# Patient Record
Sex: Female | Born: 1975 | Race: White | Hispanic: No | Marital: Married | Smoking: Never smoker
Health system: Southern US, Community
[De-identification: ages and names within clinical notes are randomized; demographics above are authoritative.]

## PROBLEM LIST (undated history)

## (undated) DIAGNOSIS — R002 Palpitations: Secondary | ICD-10-CM

## (undated) HISTORY — PX: OTHER SURGICAL HISTORY: SHX169

## (undated) HISTORY — PX: SEPTOPLASTY: SHX2393

## (undated) HISTORY — DX: Palpitations: R00.2

---

## 2004-01-29 ENCOUNTER — Inpatient Hospital Stay (HOSPITAL_COMMUNITY): Admission: AD | Admit: 2004-01-29 | Discharge: 2004-01-30 | Payer: Self-pay | Admitting: Gynecology

## 2004-11-26 ENCOUNTER — Ambulatory Visit (HOSPITAL_COMMUNITY): Admission: RE | Admit: 2004-11-26 | Discharge: 2004-11-26 | Payer: Self-pay | Admitting: Gynecology

## 2005-04-14 ENCOUNTER — Inpatient Hospital Stay (HOSPITAL_COMMUNITY): Admission: AD | Admit: 2005-04-14 | Discharge: 2005-04-16 | Payer: Self-pay | Admitting: Gynecology

## 2006-01-05 ENCOUNTER — Ambulatory Visit: Payer: Self-pay | Admitting: Gynecology

## 2006-09-20 ENCOUNTER — Ambulatory Visit: Payer: Self-pay | Admitting: Gynecology

## 2006-09-20 ENCOUNTER — Encounter (INDEPENDENT_AMBULATORY_CARE_PROVIDER_SITE_OTHER): Payer: Self-pay | Admitting: Gynecology

## 2007-07-14 ENCOUNTER — Ambulatory Visit: Payer: Self-pay | Admitting: Gynecology

## 2007-07-14 ENCOUNTER — Encounter (INDEPENDENT_AMBULATORY_CARE_PROVIDER_SITE_OTHER): Payer: Self-pay | Admitting: Gynecology

## 2008-11-08 ENCOUNTER — Ambulatory Visit: Payer: Self-pay | Admitting: Unknown Physician Specialty

## 2009-10-30 ENCOUNTER — Encounter: Payer: Self-pay | Admitting: Cardiology

## 2010-02-10 ENCOUNTER — Ambulatory Visit: Payer: Self-pay | Admitting: Cardiology

## 2010-02-10 DIAGNOSIS — R0789 Other chest pain: Secondary | ICD-10-CM | POA: Insufficient documentation

## 2010-02-10 DIAGNOSIS — R002 Palpitations: Secondary | ICD-10-CM | POA: Insufficient documentation

## 2010-02-10 DIAGNOSIS — E7800 Pure hypercholesterolemia, unspecified: Secondary | ICD-10-CM | POA: Insufficient documentation

## 2010-02-11 ENCOUNTER — Telehealth (INDEPENDENT_AMBULATORY_CARE_PROVIDER_SITE_OTHER): Payer: Self-pay | Admitting: *Deleted

## 2010-02-18 ENCOUNTER — Encounter: Payer: Self-pay | Admitting: Cardiology

## 2010-02-19 ENCOUNTER — Ambulatory Visit (HOSPITAL_COMMUNITY): Admission: RE | Admit: 2010-02-19 | Discharge: 2010-02-19 | Payer: Self-pay | Admitting: Cardiology

## 2010-02-19 ENCOUNTER — Ambulatory Visit: Payer: Self-pay

## 2010-02-19 ENCOUNTER — Ambulatory Visit: Payer: Self-pay | Admitting: Cardiology

## 2010-02-19 ENCOUNTER — Ambulatory Visit: Payer: Self-pay | Admitting: Cardiovascular Disease

## 2010-02-19 ENCOUNTER — Encounter: Payer: Self-pay | Admitting: Cardiology

## 2010-02-28 ENCOUNTER — Encounter: Payer: Self-pay | Admitting: Cardiology

## 2010-03-11 ENCOUNTER — Telehealth: Payer: Self-pay | Admitting: Cardiology

## 2010-04-22 ENCOUNTER — Encounter: Payer: Self-pay | Admitting: Cardiology

## 2010-04-23 ENCOUNTER — Ambulatory Visit: Payer: Self-pay | Admitting: Orthopedic Surgery

## 2010-04-25 LAB — PATHOLOGY REPORT

## 2010-06-18 NOTE — Progress Notes (Signed)
Summary: 24 hr Holter Monitor  Phone Note Outgoing Call Call back at [phone]   Call placed by: Stanton Kidney, EMT-P,  February 11, 2010 8:38 AM Call placed to: Patient Action Taken: Phone Call Completed Summary of Call: 24 holter monitor schedule 02/19/10 after stress echo appt.

## 2010-06-18 NOTE — Progress Notes (Signed)
Summary: LABS  Phone Note Call from Patient Call back at Home Phone [phone]   Caller: SELF Call For: [facility] Summary of Call: PT IS CALLING ABOUT BLOODWORK THAT WAS DONE AT [facility] 3 WEEKS AGO-IT DOES NOT LOOK LIKE THEY SENT IT TO [place] Initial call taken by: Harlon Flor,  March 11, 2010 11:12 AM  Follow-up for Phone Call        pt aware of labs Follow-up by: Benedict Needy, RN,  March 12, 2010 4:48 PM

## 2010-06-18 NOTE — Letter (Signed)
Summary: Medical Record Release  Medical Record Release   Imported By: Harlon Flor 04/24/2010 14:17:57  _____________________________________________________________________  External Attachment:    Type:   Image     Comment:   External Document

## 2010-06-18 NOTE — Assessment & Plan Note (Signed)
Summary: np6/family hx of early mi   Visit Type:  Initial Consult Referring Provider:  Harold Hedge II, M.D.  CC:  c/o two weeks ago had a spell where she was putting up beach chairs on a shelf and loss feeling in arms and had to drop the chairs.  She states he heart speeds up and then bottoms out. Marland Kitchen  History of Present Illness: 35 yo with very prominent family history of premature coronary disease presents for cardiology evaluation.  Patient had one brother with an MI at 35, another brother with an MI at 19 (who has now had 4 MIs), a sister with an MI at 6, father with PTCA at 65 and CVA at 77, and mother with "enlarged heart" diagnosed at 29.  She, in general, has done well.  No significant medical history.  She has had about 1 year of episodes where she will feel her heart racing.  This happens every few days and lasts for a couple of minutes at a time.  This has worried her.  2 weeks ago, she was lifting some boxes to put on a shelf and developed a pressure in her chest that lasted 5 minutes.  At the same time, she felt very weak and felt like her heart was racing.  No history of syncope.  No history of exertional chest pain/pressure or dyspnea with her moderate level of regular exercise. She does not drink caffeine and does not smoke.   ECG: NSR, normal  Labs (6/11): LDL 76, HDL 52  Preventive Screening-Counseling & Management  Alcohol-Tobacco     Smoking Status: never  Caffeine-Diet-Exercise     Does Patient Exercise: yes  Current Medications (verified): 1)  None  Allergies (verified): 1)  ! Sulfa  Past History:  Family History: Last updated: 02/10/2010 Brother #1: MI at 19 Brother #2: 4 MIs total, first at 48 Sister: MI at 49 Father: PTCA at 82, CVA at 70, multiple TIAs, died at 92 Mother: "enlarged heart" diagnosed at 9, had PCI at 52  Social History: Last updated: 02/10/2010 Married, lives in [place], has 2 children Tobacco Use - No.  Alcohol Use -  no Regular Exercise - yes--3-4 x weekly No caffeine  Past Medical History: 1. migraines 2. palpitations 3. uterine ablation  Past Surgical History: septoplasty gum reconstruciton  Family History: Brother #1: MI at 58 Brother #2: 4 MIs total, first at 17 Sister: MI at 7 Father: PTCA at 94, CVA at 82, multiple TIAs, died at 27 Mother: "enlarged heart" diagnosed at 73, had PCI at 2  Social History: Married, lives in [place], has 2 children Tobacco Use - No.  Alcohol Use - no Regular Exercise - yes--3-4 x weekly No caffeine Smoking Status:  never Does Patient Exercise:  yes  Review of Systems       All systems reviewed and negative except as per HPI.   Vital Signs:  Patient profile:   35 year old female Height:      67 inches Weight:      161 pounds BMI:     25.31 Pulse rate:   69 / minute BP sitting:   104 / 72  (left arm) Cuff size:   regular  Vitals Entered By: Bishop Dublin, CMA (February 10, 2010 2:46 PM)  Physical Exam  General:  Well developed, well nourished, in no acute distress. Head:  normocephalic and atraumatic Nose:  no deformity, discharge, inflammation, or lesions Mouth:  Teeth, gums and palate normal. Oral mucosa normal.  Neck:  Neck supple, no JVD. No masses, thyromegaly or abnormal cervical nodes. Lungs:  Clear bilaterally to auscultation and percussion. Heart:  Non-displaced PMI, chest non-tender; regular rate and rhythm, S1, S2 without murmurs, rubs or gallops. Carotid upstroke normal, no bruit. Pedals normal pulses. No edema, no varicosities. Abdomen:  Bowel sounds positive; abdomen soft and non-tender without masses, organomegaly, or hernias noted. No hepatosplenomegaly. Msk:  Back normal, normal gait. Muscle strength and tone normal. Extremities:  No clubbing or cyanosis. Neurologic:  Alert and oriented x 3. Skin:  Intact without lesions or rashes. Psych:  Normal affect.   Impression & Recommendations:  Problem # 1:  CHEST  DISCOMFORT (ICD-786.59) Episode of chest pressure lasting for about 5 minutes 2 weeks ago while reaching up to a shelf.  No exertional symptoms.  Suspect this is not cardiac, but given patient's very strong family history of premature CAD presenting in the 30s, I think we probably need to investigate this farther.  I will schedule her for a stress echo.    Problem # 2:  PALPITATIONS (ICD-785.1) Patient feels her heart race periodically.  No history of syncope but it is bothersome.  I will set her up for a 48 hour holter monitor.   Problem # 3:  FAMILY HISTORY OF CAD Patient has a very strong family history of premature coronary disease, with all siblings having MIs in their 30s.  She no longer has her period (s/p uterine ablation) and has no history of pathologic bleeding, so I think that she should start ASA 81 mg daily.  Her lipids looked good in 6/11, but given her family history I am going to set her up for a Lipomed profile to assess LDL particle number, etc.  Also given family history, I will check lipoprotein(a) and hs-CRP.  If any of these are abnormal, I will start a statin.   I asked her to take fish oil 2000 mg daily.   Other Orders: Stress Echo (Stress Echo) Holter (Holter) T-NMR, Lipoprofile ([id number])  Patient Instructions: 1)  Your physician has recommended you make the following change in your medication: START fish oil and aspirin 2)  Your physician has requested that you have a stress echocardiogram. For further information please visit [email or web address]  Please follow instruction sheet as given. 3)  Your physician has recommended that you wear a holter monitor.  Holter monitors are medical devices that record the heart's electrical activity. Doctors most often use these monitors to diagnose arrhythmias. Arrhythmias are problems with the speed or rhythm of the heartbeat. The monitor is a small, portable device. You can wear one while you do your normal daily activities. This  is usually used to diagnose what is causing palpitations/syncope (passing out).  Appended Document: Orders Update    Clinical Lists Changes  Orders: Added new Test order of T-Lipoprotein (a) [phone]) - Signed Added new Test order of CRP, high sensitivity-[facility] [phone]) - Signed

## 2010-06-18 NOTE — Procedures (Signed)
Summary: summary report  summary report   Imported By: Mirna Mires 02/28/2010 11:06:14  _____________________________________________________________________  External Attachment:    Type:   Image     Comment:   External Document

## 2010-11-28 ENCOUNTER — Encounter: Payer: Self-pay | Admitting: Cardiology

## 2011-01-07 ENCOUNTER — Ambulatory Visit: Payer: Self-pay | Admitting: Plastic Surgery

## 2012-09-08 ENCOUNTER — Emergency Department: Payer: Self-pay | Admitting: Unknown Physician Specialty

## 2012-09-08 LAB — CBC
HGB: 12.9 g/dL (ref 12.0–16.0)
MCH: 31.5 pg (ref 26.0–34.0)
MCV: 90 fL (ref 80–100)
RBC: 4.11 10*6/uL (ref 3.80–5.20)
RDW: 12.1 % (ref 11.5–14.5)
WBC: 5.2 10*3/uL (ref 3.6–11.0)

## 2012-09-08 LAB — URINALYSIS, COMPLETE
Bilirubin,UR: NEGATIVE
Glucose,UR: NEGATIVE mg/dL (ref 0–75)

## 2012-09-08 LAB — MAGNESIUM: Magnesium: 1.6 mg/dL — ABNORMAL LOW

## 2012-09-08 LAB — COMPREHENSIVE METABOLIC PANEL
Albumin: 3 g/dL — ABNORMAL LOW (ref 3.4–5.0)
Anion Gap: 6 — ABNORMAL LOW (ref 7–16)
Bilirubin,Total: 0.5 mg/dL (ref 0.2–1.0)
Chloride: 107 mmol/L (ref 98–107)
Creatinine: 0.55 mg/dL — ABNORMAL LOW (ref 0.60–1.30)
EGFR (Non-African Amer.): >60
Glucose: 105 mg/dL — ABNORMAL HIGH (ref 65–99)
Potassium: 3 mmol/L — ABNORMAL LOW (ref 3.5–5.1)
SGPT (ALT): 164 U/L — ABNORMAL HIGH (ref 12–78)

## 2012-09-08 LAB — LIPASE, BLOOD: Lipase: 204 U/L (ref 73–393)

## 2012-09-08 LAB — HCG, QUANTITATIVE, PREGNANCY: Beta Hcg, Quant.: <1 m[IU]/mL — ABNORMAL LOW

## 2012-09-19 ENCOUNTER — Ambulatory Visit: Payer: Self-pay | Admitting: Gastroenterology

## 2012-09-19 LAB — HEPATIC FUNCTION PANEL A (ARMC)
Albumin: 3.7 g/dL (ref 3.4–5.0)
Alkaline Phosphatase: 133 U/L (ref 50–136)
Bilirubin, Direct: 0.1 mg/dL (ref 0.00–0.20)
SGOT(AST): 56 U/L — ABNORMAL HIGH (ref 15–37)

## 2012-09-22 ENCOUNTER — Ambulatory Visit: Payer: Self-pay | Admitting: Gastroenterology

## 2012-09-26 ENCOUNTER — Other Ambulatory Visit: Payer: Self-pay | Admitting: Gastroenterology

## 2012-09-26 LAB — HEPATIC FUNCTION PANEL A (ARMC)
Bilirubin, Direct: 0.2 mg/dL (ref 0.00–0.20)
Bilirubin,Total: 0.6 mg/dL (ref 0.2–1.0)
SGOT(AST): 21 U/L (ref 15–37)

## 2012-09-26 LAB — PROTIME-INR
INR: 1.1
Prothrombin Time: 14 secs (ref 11.5–14.7)

## 2012-09-26 LAB — SEDIMENTATION RATE: Erythrocyte Sed Rate: 4 mm/hr (ref 0–20)

## 2012-09-26 LAB — LACTATE DEHYDROGENASE: LDH: 172 U/L (ref 81–246)

## 2012-09-26 LAB — CK: CK, Total: 56 U/L (ref 21–215)

## 2012-09-27 ENCOUNTER — Other Ambulatory Visit: Payer: Self-pay | Admitting: Obstetrics and Gynecology

## 2012-09-27 DIAGNOSIS — N6320 Unspecified lump in the left breast, unspecified quadrant: Secondary | ICD-10-CM

## 2012-10-06 DIAGNOSIS — R197 Diarrhea, unspecified: Secondary | ICD-10-CM | POA: Insufficient documentation

## 2012-10-06 DIAGNOSIS — R748 Abnormal levels of other serum enzymes: Secondary | ICD-10-CM | POA: Insufficient documentation

## 2012-10-06 DIAGNOSIS — R109 Unspecified abdominal pain: Secondary | ICD-10-CM | POA: Insufficient documentation

## 2012-10-08 DIAGNOSIS — R5383 Other fatigue: Secondary | ICD-10-CM | POA: Insufficient documentation

## 2012-10-11 ENCOUNTER — Ambulatory Visit
Admission: RE | Admit: 2012-10-11 | Discharge: 2012-10-11 | Disposition: A | Discharge status: Home / Self Care | Payer: BC Managed Care – PPO | Source: Ambulatory Visit | Attending: Obstetrics and Gynecology | Admitting: Obstetrics and Gynecology

## 2012-10-11 DIAGNOSIS — N6320 Unspecified lump in the left breast, unspecified quadrant: Secondary | ICD-10-CM

## 2014-12-13 IMAGING — CT CT ABD-PELV W/ CM
1 of 2 series · 15 of 32 positions shown, 19 images · IV contrast (isovue)
Comparison: none

REASON FOR EXAM: (1) abd pain; (2) abd pain
COMMENTS:

PROCEDURE:     CT  - CT ABDOMEN / PELVIS  W  - September 08, 2012  [DATE]
RESULT:     Technique: Helical 3 mm sections were obtained from the lung
bases through the superior iliac crest status post intravenous
administration of 85mL Isovue 370.

[Series 2: 3mm soft tissue · axial · 0.73mm/px · z∈[-504,-75]mm · 15 of 157 slices shown, 19 images]
[im 7/157  soft-tissue]
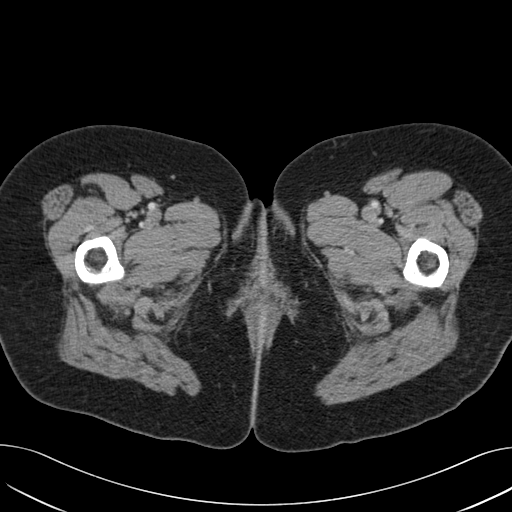
[im 7/157  bone]
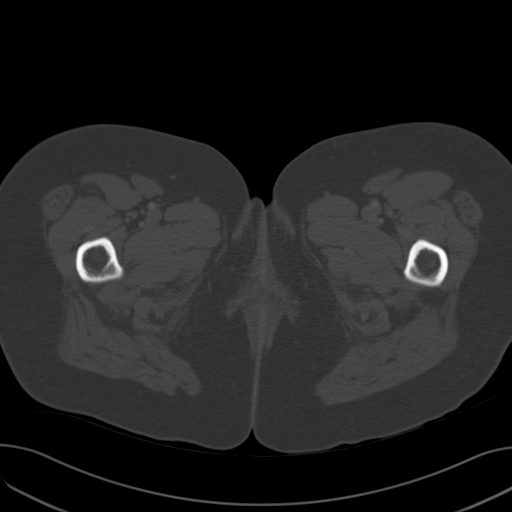
[im 19/157  soft-tissue]
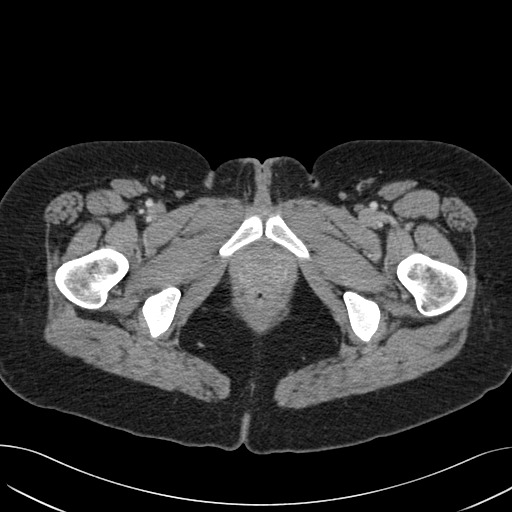
[im 32/157  soft-tissue]
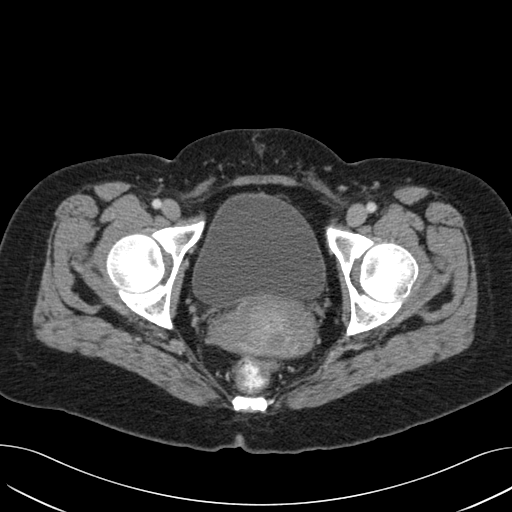
[im 44/157  soft-tissue]
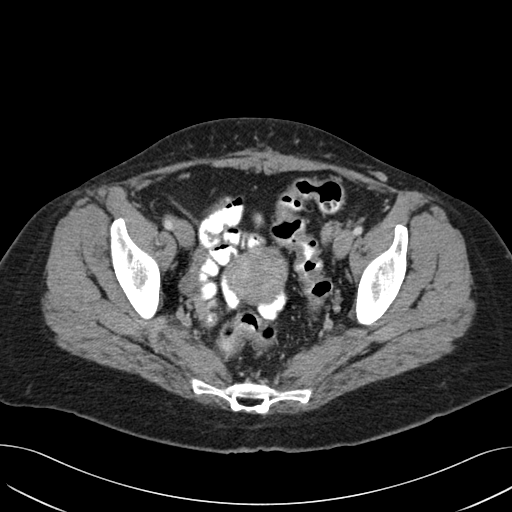
[im 57/157  soft-tissue]
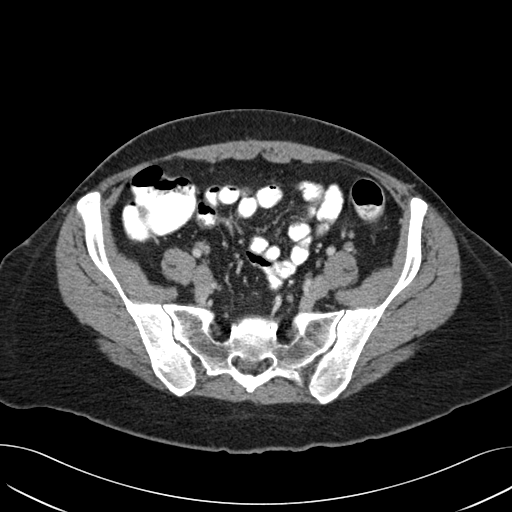
[im 69/157  soft-tissue]
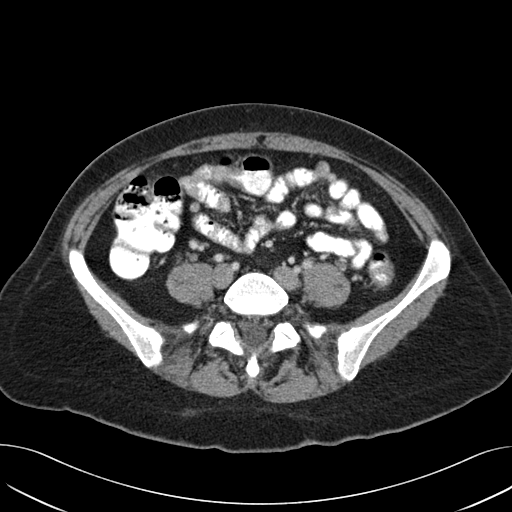
[im 82/157  soft-tissue]
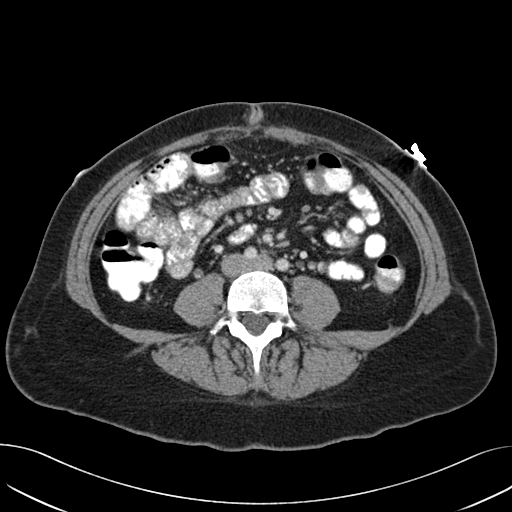
[im 88/157  soft-tissue]
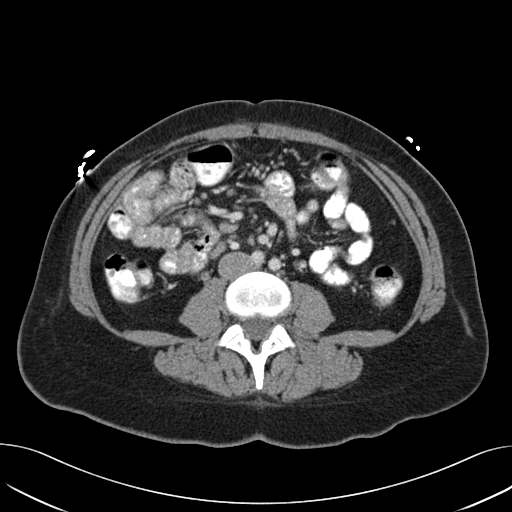
[im 100/157  soft-tissue]
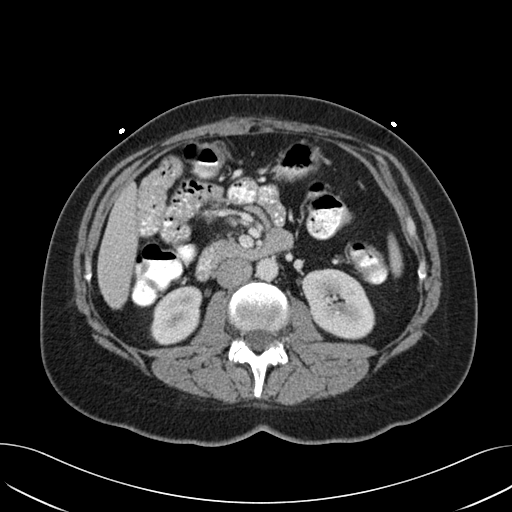
[im 100/157  bone]
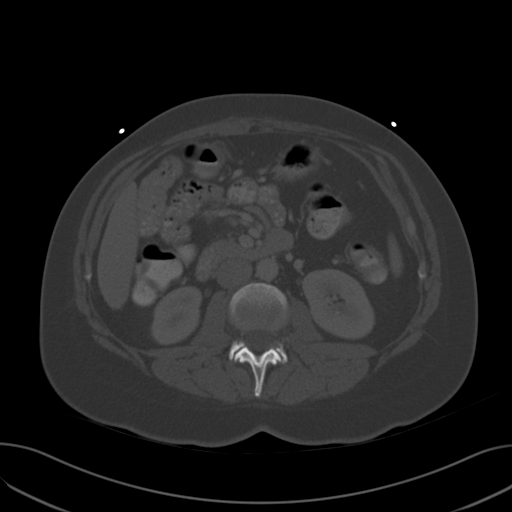
[im 113/157  soft-tissue]
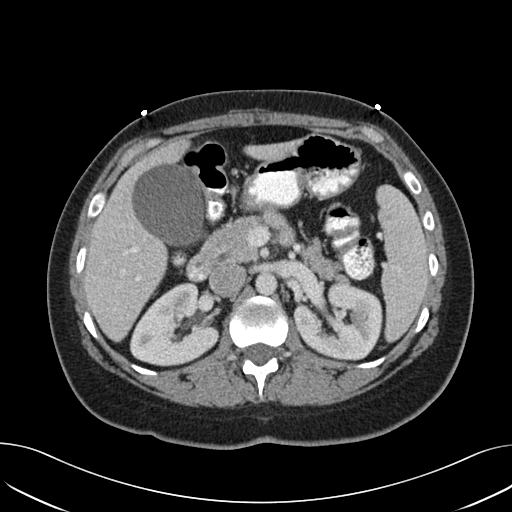
[im 125/157  soft-tissue]
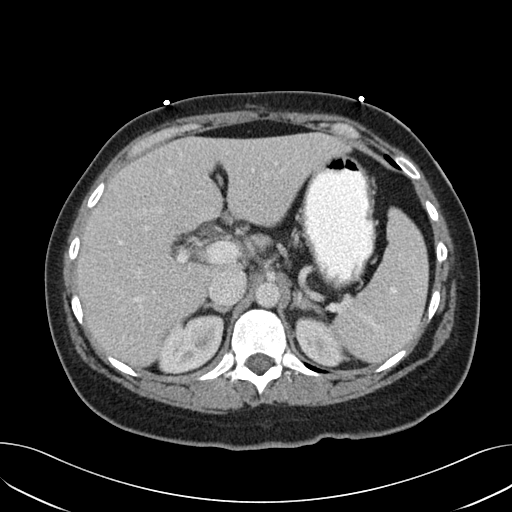
[im 132/157  lung]
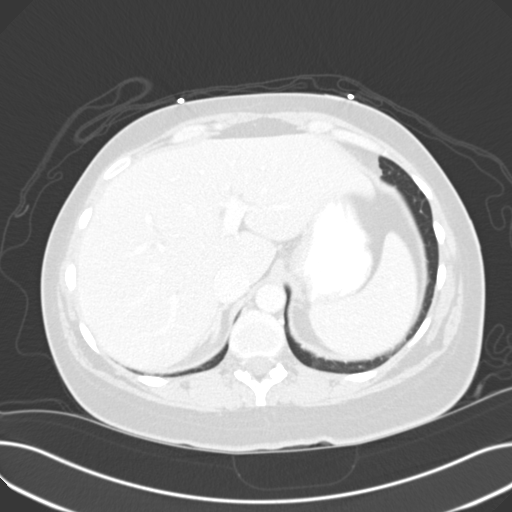
[im 138/157  soft-tissue]
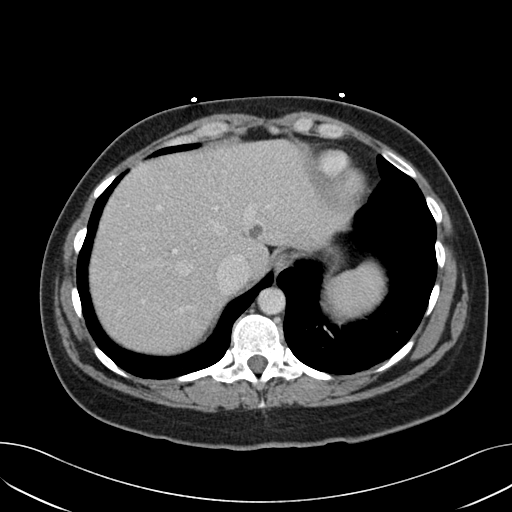
[im 138/157  lung]
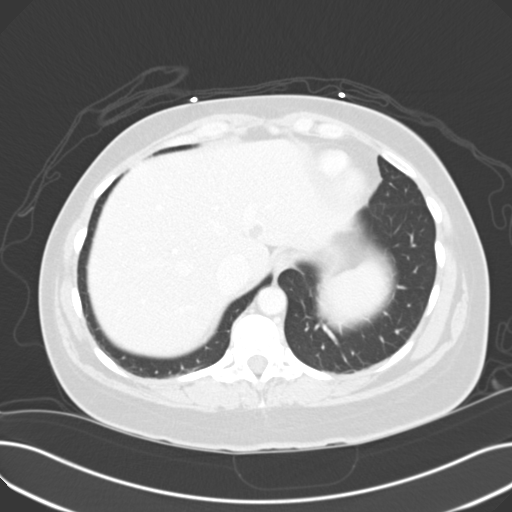
[im 144/157  lung]
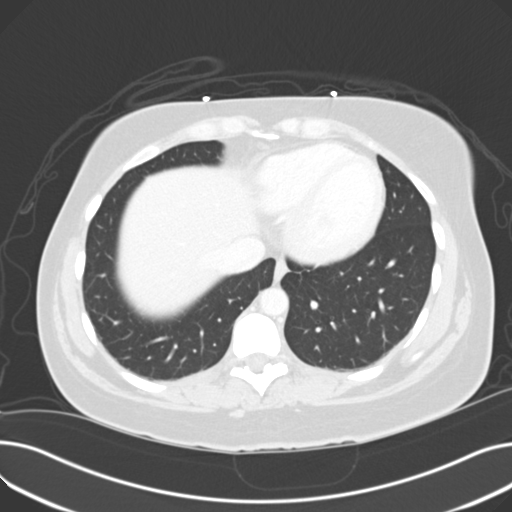
[im 150/157  soft-tissue]
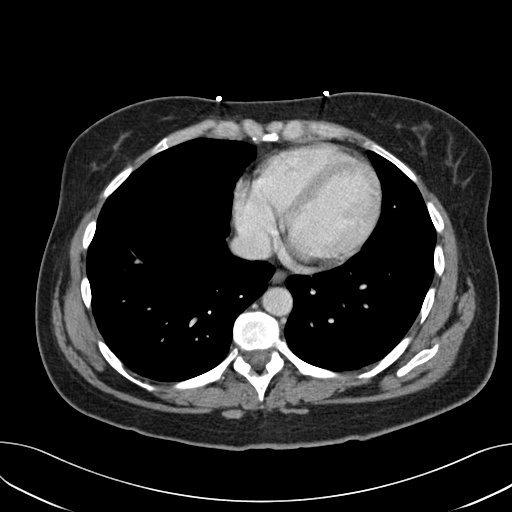
[im 150/157  lung]
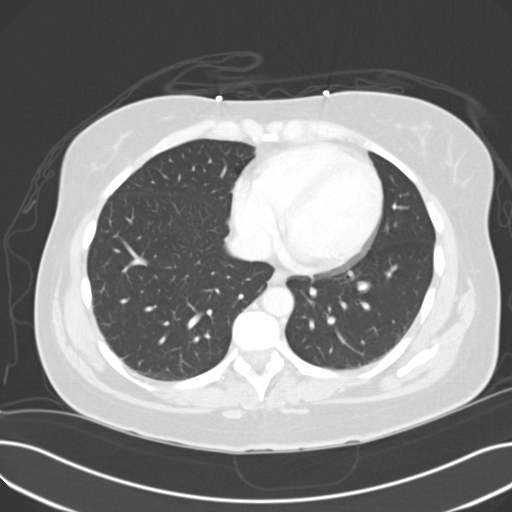

[15 of 32 positions shown; findings below may reference images not displayed]

FINDINGS: The lung bases are unremarkable.

The liver, spleen, adrenals, pancreas, and kidneys are unremarkable.

There is not evidence of bowel obstruction, enteritis, colitis nor
diverticulitis.

There is not evidence of an abdominal aortic aneurysm. The celiac, SMA, IMA,
portal vein, and SMV are opacified.

The gallbladder fossa is unremarkable. The appendix is identified and is
unremarkable.

There is no evidence of abdominal free fluid, loculated fluid collections,
masses nor adenopathy.

Mild to moderately prominent lymph nodes identified within the right lower
quadrant. The largest measures 1.03 cm in short axis on image #87. These may
represent reactive is from prior infection or if clinically appropriate the
sequela of inflammatory disease. There is no evidence of retroperitoneal
adenopathy an etiology such as and heme poetic disorder is much lower
differential consideration though clinical correlation recommended.
IMPRESSION: A one mild to moderately prominent lymph nodes the right
lower quadrant clinical correlation recommended. Different considerations
are reactive lymph nodes from infectious or inflammatory etiologies. Ominous
etiologies again cannot be excluded clinically appropriate.
2. Otherwise No further CT evidence of obstructive or inflammatory
abnormalities.

## 2014-12-13 IMAGING — US US PELV - US TRANSVAGINAL
1 series · 14 of 25 positions shown · non-contrast
Comparison: none

REASON FOR EXAM: acute pelvic pain
COMMENTS:   LMP: N/A

[Series 1: us pelv - us transvaginal · 0.28mm/px · 14 of 62 slices shown]
[im 1/62]
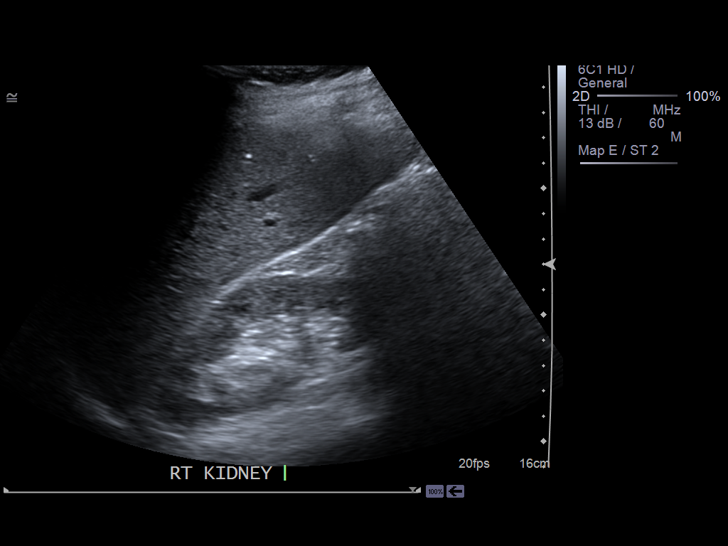
[im 6/62]
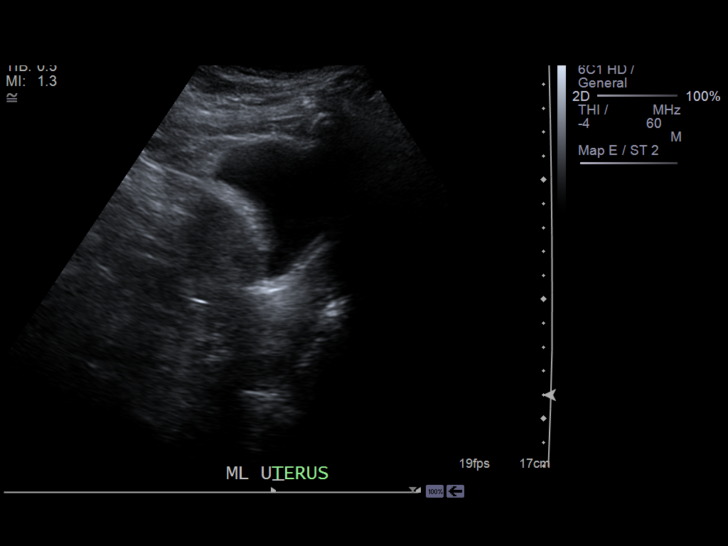
[im 11/62]
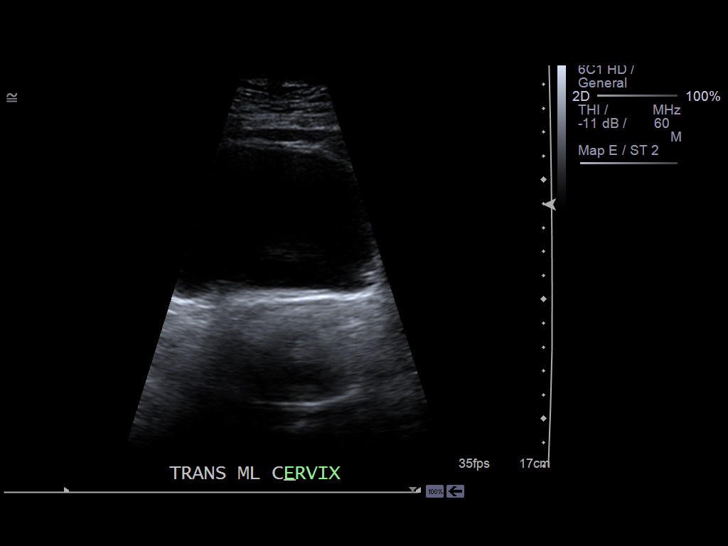
[im 16/62]
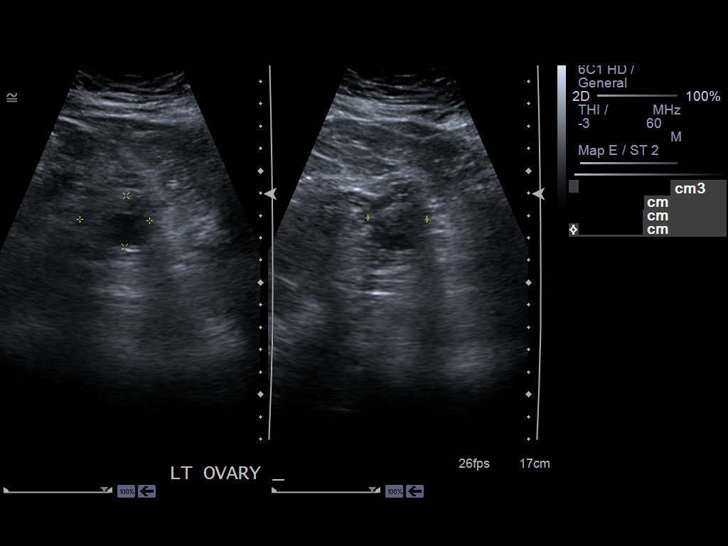
[im 21/62]
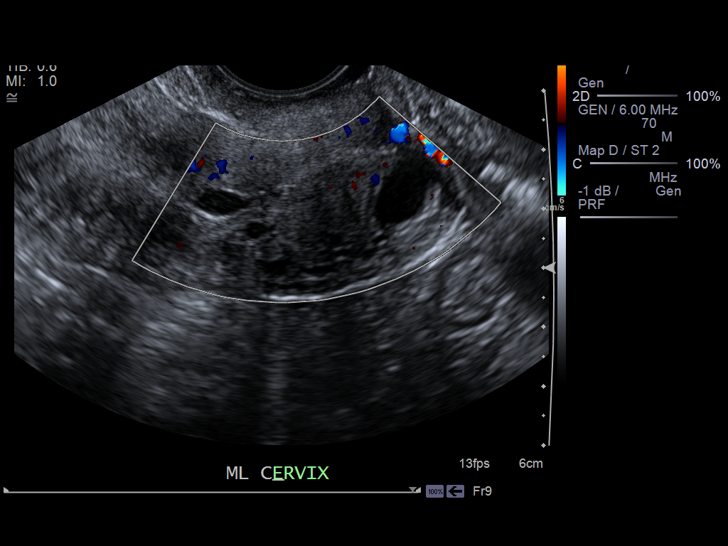
[im 23/62]
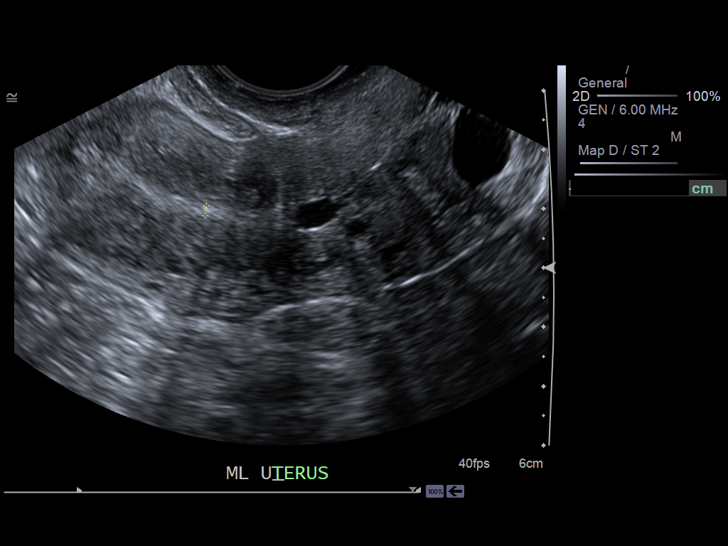
[im 28/62]
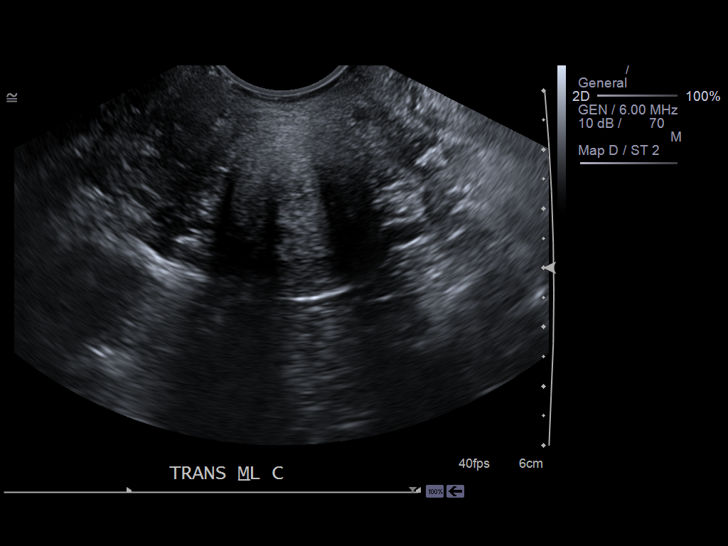
[im 34/62]
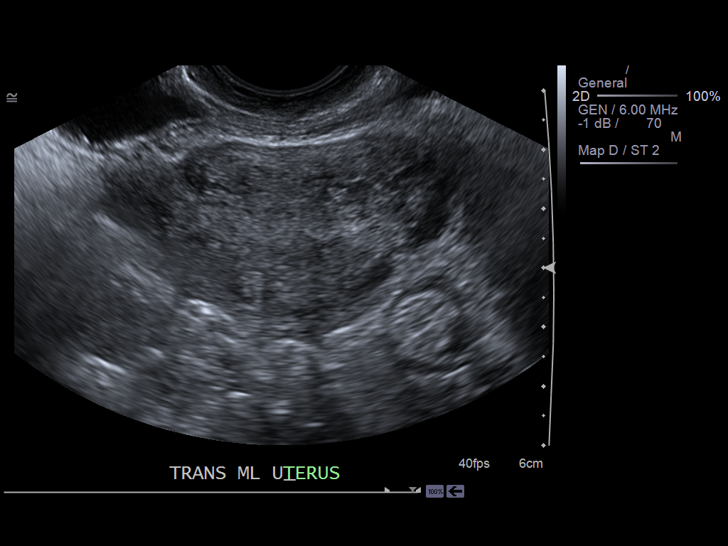
[im 39/62]
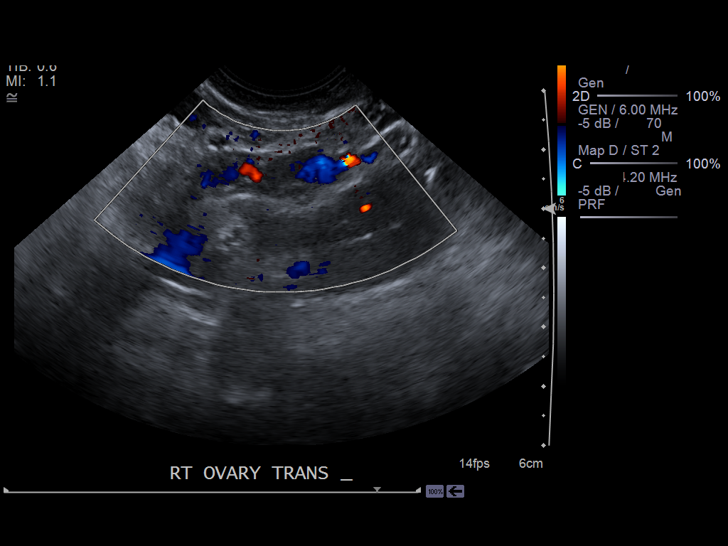
[im 41/62]
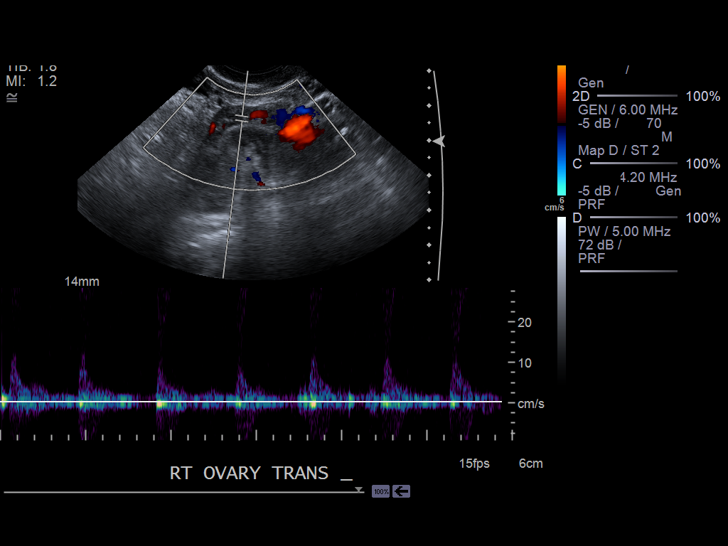
[im 46/62]
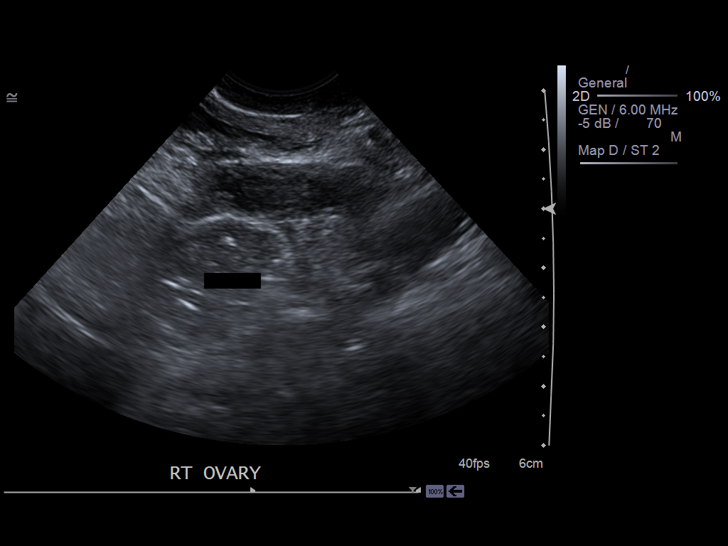
[im 51/62]
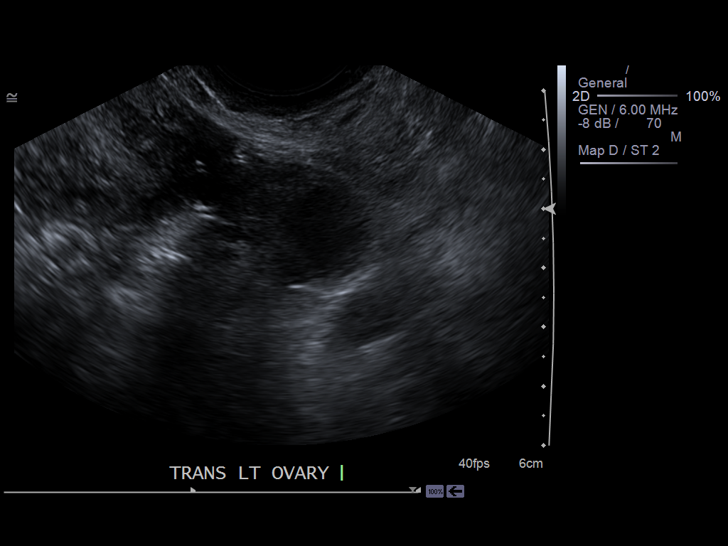
[im 56/62]
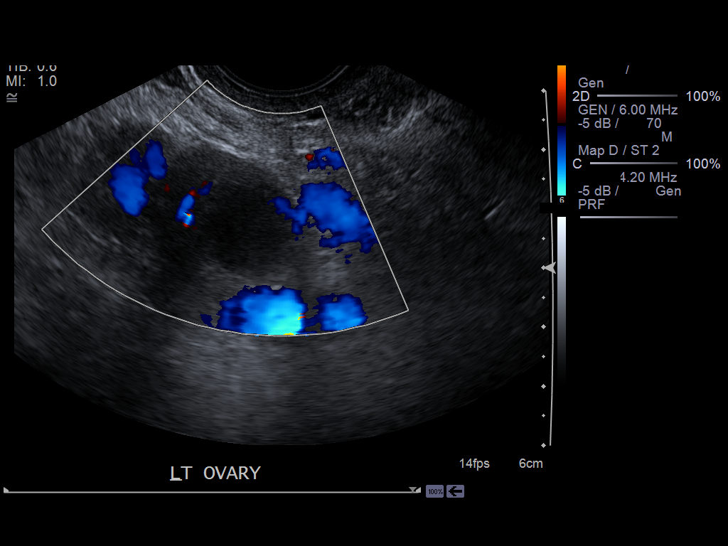
[im 62/62]
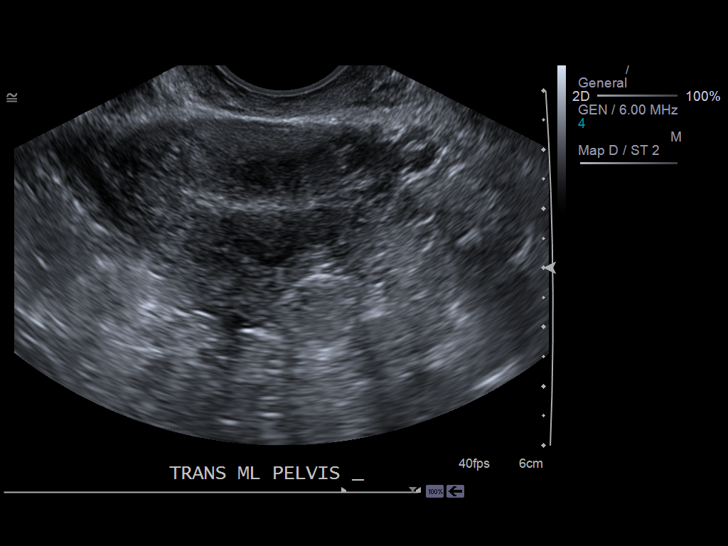

[14 of 25 positions shown; findings below may reference images not displayed]

PROCEDURE:     US  - US PELVIS EXAM W/TRANSVAGINAL  - September 08, 2012 [DATE]

RESULT:     Transabdominal and transvaginal imaging was performed through
the pelvis.

The uterus is normal in echotexture and contour. It measures 8.5 x 4.8 x
cm. The endometrial stripe measures just under 2 mm in thickness. There is
no abnormal endometrial fluid collection. There is no free fluid in the
cul-de-sac. Nabothian cysts are present within the cervix.

The right ovary measures 2.2 x 1 x 1.8 cm. The left ovary measures 3.1 x
x 2.6 cm. There is a cyst versus dominant follicle in the left ovary
measuring 1.6 cm maximally. Vascularity of both ovaries is normal.
IMPRESSION: 1. The uterus is normal in appearance. Nabothian cysts are present.
2. A dominant cyst versus follicle on the left measuring 1.6 cm is present.
Vascularity of the ovaries is normal.
3. There is no significant free fluid in the cul-de-sac.

[REDACTED]

## 2015-09-03 ENCOUNTER — Ambulatory Visit (INDEPENDENT_AMBULATORY_CARE_PROVIDER_SITE_OTHER): Payer: BLUE CROSS/BLUE SHIELD | Admitting: Family Medicine

## 2015-09-03 ENCOUNTER — Encounter: Payer: Self-pay | Admitting: Family Medicine

## 2015-09-03 VITALS — BP 121/83 | HR 89 | Temp 98.5°F | Resp 16 | Ht 68.0 in | Wt 190.0 lb

## 2015-09-03 DIAGNOSIS — H6092 Unspecified otitis externa, left ear: Secondary | ICD-10-CM | POA: Diagnosis not present

## 2015-09-03 MED ORDER — NEOMYCIN-POLYMYXIN-HC 3.5-10000-1 OT SOLN: 3.0000 [drp] | Freq: Four times a day (QID) | OTIC | Status: DC

## 2015-09-03 NOTE — Patient Instructions (Signed)

## 2015-09-03 NOTE — Progress Notes (Signed)
Subjective:    Patient ID: Butch PennyGinger A Davidson, female    DOB: 09-04-1975, 40 y.o.   MRN: [id number]  HPI: Jaely A Manson PasseyBrown is a 40 y.o. female presenting on 09/03/2015 for Otalgia   HPI  Pt presents for earache x 2 days. No fevers at home. No sinus pain, pressure, or congestion. No change in hearing.  L ear pain. Was at the beach this weekend and got water in her ear.  No home treatment.   Past Medical History  Diagnosis Date  . Migraine   . Palpitation     Current Outpatient Prescriptions on File Prior to Visit  Medication Sig  . aspirin 81 MG EC tablet Take 81 mg by mouth daily.    . Omega-3 Fatty Acids (FISH OIL) 1000 MG CAPS Take 2 capsules by mouth daily.     No current facility-administered medications on file prior to visit.    Review of Systems  Constitutional: Negative for fever and chills.  HENT: Positive for ear pain. Negative for congestion, postnasal drip, rhinorrhea and sinus pressure.   Respiratory: Negative for chest tightness, shortness of breath and wheezing.   Cardiovascular: Negative for chest pain, palpitations and leg swelling.   Per HPI unless specifically indicated above     Objective:    BP 121/83 mmHg  Pulse 89  Temp(Src) 98.5 F (36.9 C) (Oral)  Resp 16  Ht 5\' 8"  (1.727 m)  Wt 190 lb (86.183 kg)  BMI 28.90 kg/m2  Wt Readings from Last 3 Encounters:  09/03/15 190 lb (86.183 kg)  02/10/10 161 lb (73.029 kg)    Physical Exam  Constitutional: She is oriented to person, place, and time. She appears well-developed and well-nourished. No distress.  HENT:  Head: Normocephalic and atraumatic.  Right Ear: Hearing and tympanic membrane normal.  Left Ear: Tympanic membrane normal. There is swelling and tenderness. No decreased hearing is noted.  Nose: No mucosal edema or rhinorrhea. Right sinus exhibits no maxillary sinus tenderness and no frontal sinus tenderness. Left sinus exhibits no maxillary sinus tenderness and no frontal sinus tenderness.    Mouth/Throat: Uvula is midline, oropharynx is clear and moist and mucous membranes are normal.  Edematous and erythematous auditory canal on L side.   Neck: Normal range of motion. Neck supple.  Neurological: She is alert and oriented to person, place, and time.  Skin: She is not diaphoretic.   Results for orders placed or performed in visit on 09/26/12  Hepatic Function Panel A [facility])  Result Value Ref Range   SGOT(AST) 21 15-37 Unit/L   SGPT (ALT) 63 12-78 U/L   Alkaline Phosphatase 124 50-136 Unit/L   Albumin 3.8 3.4-5.0 g/dL   Total Protein 7.6 1.3-0.86.4-8.2 g/dL   Bilirubin,Total 0.6 6.5-7.80.2-1.0 mg/dL   Bilirubin, Direct 0.2 0.00-0.20 mg/dL  Protime-INR  Result Value Ref Range   Prothrombin Time 14.0 11.5-14.7 secs   INR 1.1   CK  Result Value Ref Range   CK, Total 56 21-215 Unit/L  Sedimentation rate  Result Value Ref Range   Erythrocyte Sed Rate 4 0-20 mm/hr  Lactate dehydrogenase  Result Value Ref Range   LDH 172 81-246 Unit/L      Assessment & Plan:   Problem List Items Addressed This Visit    None    Visit Diagnoses    Otitis externa, left    -  Primary    Otitis externa- cortisporin drops four times daily. Return if not improving.     Relevant Medications  neomycin-polymyxin-hydrocortisone (CORTISPORIN) otic solution       Meds ordered this encounter  Medications  . neomycin-polymyxin-hydrocortisone (CORTISPORIN) otic solution    Sig: Place 3 drops into the left ear 4 (four) times daily. For 1 week.    Dispense:  10 mL    Refill:  0    Order Specific Question:  Supervising Provider    Answer:  Janeann Forehand [[id number]]      Follow up plan: Return if symptoms worsen or fail to improve.

## 2015-10-06 DIAGNOSIS — M25521 Pain in right elbow: Secondary | ICD-10-CM | POA: Diagnosis not present

## 2015-10-07 DIAGNOSIS — M7711 Lateral epicondylitis, right elbow: Secondary | ICD-10-CM | POA: Diagnosis not present

## 2016-05-28 DIAGNOSIS — Z01419 Encounter for gynecological examination (general) (routine) without abnormal findings: Secondary | ICD-10-CM | POA: Diagnosis not present

## 2016-05-28 DIAGNOSIS — Z780 Asymptomatic menopausal state: Secondary | ICD-10-CM | POA: Diagnosis not present

## 2016-05-28 DIAGNOSIS — Z6829 Body mass index (BMI) 29.0-29.9, adult: Secondary | ICD-10-CM | POA: Diagnosis not present

## 2016-05-29 ENCOUNTER — Other Ambulatory Visit: Payer: Self-pay | Admitting: Obstetrics and Gynecology

## 2016-05-29 DIAGNOSIS — N644 Mastodynia: Secondary | ICD-10-CM

## 2016-06-03 ENCOUNTER — Other Ambulatory Visit: Payer: Self-pay

## 2016-06-08 ENCOUNTER — Ambulatory Visit
Admission: RE | Admit: 2016-06-08 | Discharge: 2016-06-08 | Disposition: A | Discharge status: Home / Self Care | Payer: BLUE CROSS/BLUE SHIELD | Source: Ambulatory Visit | Attending: Obstetrics and Gynecology | Admitting: Obstetrics and Gynecology

## 2016-06-08 DIAGNOSIS — R922 Inconclusive mammogram: Secondary | ICD-10-CM | POA: Diagnosis not present

## 2016-06-08 DIAGNOSIS — N644 Mastodynia: Secondary | ICD-10-CM

## 2016-08-11 ENCOUNTER — Encounter: Payer: Self-pay | Admitting: *Deleted

## 2016-08-11 ENCOUNTER — Ambulatory Visit (INDEPENDENT_AMBULATORY_CARE_PROVIDER_SITE_OTHER): Payer: BLUE CROSS/BLUE SHIELD | Admitting: Nurse Practitioner

## 2016-08-11 ENCOUNTER — Encounter: Payer: Self-pay | Admitting: Nurse Practitioner

## 2016-08-11 VITALS — BP 122/81 | HR 83 | Temp 98.9°F | Resp 16 | Ht 68.0 in | Wt 189.0 lb

## 2016-08-11 DIAGNOSIS — J069 Acute upper respiratory infection, unspecified: Secondary | ICD-10-CM

## 2016-08-11 DIAGNOSIS — J029 Acute pharyngitis, unspecified: Secondary | ICD-10-CM | POA: Diagnosis not present

## 2016-08-11 DIAGNOSIS — B9789 Other viral agents as the cause of diseases classified elsewhere: Secondary | ICD-10-CM

## 2016-08-11 LAB — POCT RAPID STREP A (OFFICE): Rapid Strep A Screen: NEGATIVE

## 2016-08-11 MED ORDER — FLUTICASONE PROPIONATE 50 MCG/ACT NA SUSP: 2.0000 | Freq: Every day | NASAL | 1 refills | Status: DC

## 2016-08-11 NOTE — Progress Notes (Signed)
Subjective:    Patient ID: Crystal Mercer, female    DOB: 1975/07/14, 41 y.o.   MRN: [id number]  Crystal Mercer is a 41 y.o. female presenting on 08/11/2016 for Ear Pain (sore from throat rediate to earcanal on Left side onset 3 days getting worst from yesterday)   HPI  Sore Throat Sore throat with pain that radiates into L ear. Ear pain is described as an ache that is worse when she swallows.  She reports some nasal congestion and post nasal drip.  She denies fever, chills, sweats, nausea, vomiting, diarrhea, cough, sinus pain and pressure, and ear fullness.  Sick contacts possible in nursery at church.  Sore R elbow Crystal Mercer also reports a sore R elbow diagnosed about 1 year ago for which she has had a steroid injection.  Bruise on lateral epicondyle of the right elbow.  She is able to utilize her arm for activity and notes she is "very active."  Her concern is the length of time she has had pain without improvement.  She requests ortho referral for further evaluation and management.    Social History  Substance Use Topics  . Smoking status: Never Smoker  . Smokeless tobacco: Never Used     Comment: tobacco use - no  . Alcohol use No    Review of Systems Per HPI unless specifically indicated above     Objective:    BP 122/81   Pulse 83   Temp 98.9 F (37.2 C) (Oral)   Resp 16   Ht 5\' 8"  (1.727 m)   Wt 189 lb (85.7 kg)   SpO2 100%   BMI 28.74 kg/m   Wt Readings from Last 3 Encounters:  08/11/16 189 lb (85.7 kg)  09/03/15 190 lb (86.2 kg)  02/10/10 161 lb (73 kg)    Physical Exam  Constitutional: She is oriented to person, place, and time. She appears well-developed and well-nourished. No distress.  HENT:  Head: Normocephalic and atraumatic.  Right Ear: External ear normal.  Left Ear: External ear normal.  Nose: Mucosal edema and rhinorrhea present. Right sinus exhibits no maxillary sinus tenderness and no frontal sinus tenderness. Left sinus exhibits no maxillary  sinus tenderness and no frontal sinus tenderness.  Mouth/Throat: Posterior oropharyngeal edema and posterior oropharyngeal erythema present.  Cobblestoning of oropharynx, Mallampati score 2  Eyes: Conjunctivae and EOM are normal. Pupils are equal, round, and reactive to light.  Neck: Normal range of motion. Neck supple.  Cardiovascular: Normal rate, regular rhythm, normal heart sounds and intact distal pulses.   Pulmonary/Chest: Effort normal and breath sounds normal. No respiratory distress. She has no wheezes. She has no rales.  Musculoskeletal: Normal range of motion.  Lymphadenopathy:    She has no cervical adenopathy.  Neurological: She is alert and oriented to person, place, and time.  Skin: Skin is warm and dry.  Psychiatric: She has a normal mood and affect. Her behavior is normal. Judgment and thought content normal.   Results for orders placed or performed in visit on 08/11/16  POCT rapid strep A  Result Value Ref Range   Rapid Strep A Screen Negative Negative      Assessment & Plan:   Problem List Items Addressed This Visit    None    Visit Diagnoses    Sore throat    -  Primary   Relevant Orders   POCT rapid strep A (Completed)   Viral upper respiratory tract infection  1. Take loratadine (Claritin) 10mg   daily. 2. Use Flonase 2 sprays each nostril daily for up to 4-6 weeks. 3. For increased congestion, take OTC Mucinex for 7-10 days, then stop. 4. Encourage fluids. 5. Take Tylenol extra strength 1 to 2 tablets every 6-8 hours for aches or fever/chills as needed. Do not take more than 3,000 mg in 24 hours from all medicines.  May take Ibuprofen 200-400mg  every 8 hours as needed.           Meds ordered this encounter  Medications  . fluticasone (FLONASE) 50 MCG/ACT nasal spray    Sig: Place 2 sprays into both nostrils daily.    Dispense:  16 g    Refill:  1    Order Specific Question:   Supervising Provider    Answer:   Smitty Cords [[id number]]       Follow up plan: Return 7-10 days if symptoms worsen or fail to improve.   Wilhelmina Mcardle, DNP, AGNP-BC Adult Gerontology Nurse Practitioner [facility] 08/11/2016, 2:51 PM

## 2016-08-11 NOTE — Progress Notes (Signed)
I have reviewed this encounter including the documentation in this note and/or discussed this patient with the provider, Wilhelmina McardleLauren Kennedy, AGPCNP-BC. I am certifying that I agree with the content of this note as supervising physician.  Saralyn PilarAlexander Athens Lebeau, DO [facility] 08/11/2016, 6:46 PM

## 2016-08-11 NOTE — Patient Instructions (Addendum)
Cataleyah, Thank you for coming in to clinic today.  1. It sounds like you have a Upper Respiratory Virus - this will most likely run it's course in 7 to 10 days. Recommend good hand washing. - Start anti-histamine loratadine (Claritin) 10mg  daily, also can use Flonase 2 sprays each nostril daily for up to 4-6 weeks. - If congestion is worse, start OTC Mucinex (or may try Mucinex-DM for cough) up to 7-10 days then stop - Drink plenty of fluids to improve congestion - Drink warm herbal tea with honey for sore throat. - Start taking Tylenol extra strength 1 to 2 tablets every 6-8 hours for aches or fever/chills for next few days as needed.  Do not take more than 3,000 mg in 24 hours from all medicines.  May take Ibuprofen as well if tolerated 200-400mg  every 8 hours as needed.  If symptoms significantly worsening with persistent fevers/chills despite tylenol/ibpurofen, nausea, vomiting unable to tolerate food/fluids or medicine, body aches, or shortness of breath, sinus pain pressure or worsening productive cough, then follow-up for re-evaluation, may seek more immediate care at Urgent Care or ED if more concerned for emergency.  Please schedule a follow-up appointment with Wilhelmina McardleLauren Orien Mayhall, AGNP in 7-10 days as needed.  If you have any other questions or concerns, please feel free to call the clinic or send a message through MyChart. You may also schedule an earlier appointment if necessary.  Wilhelmina McardleLauren Belal Scallon, DNP, AGNP-BC Adult Gerontology Nurse Practitioner [facility]    ---------------------------------------------------------------------- ORTHOPEDICS & SPORTS MEDICINE   GENERAL [facility] Address: [address], [place], [place] [address] Hours:  9AM-5PM Phone: [phone]) [phone]  Reyne DumasPhil Davidow, PT, DPT  Sherlynn CarbonElizabeth Nora Fojtik, MD  Allena KatzMaurice Jones Jr, PA-C  Kathreen DevoidKevin L. Krasinski, MD  Cruz CondonMelissa Lanes, MS, RD, LDN  Smiley HousemanAlexander Meloy, PA-C,  MMS  Valinda HoarHoward E. Miller, MD >> Back specialist Traci Mims, PTA  Derryl Harborynthia Porfilio, PT  Alexia H. Stephenie AcresSoria, MD  [facility] [address] [place], [place] [address] Phone: [phone]) [phone]  Appointment Phone [phone]  Dr Oletta CohnWilliam M Gramig, III - Hand / Upper Ext specialist   [facility]  [facility] [address] [place], [place] [address] Phone: [phone] Fax: [phone] - Sports injuries, return to work rehab

## 2016-08-12 DIAGNOSIS — M7711 Lateral epicondylitis, right elbow: Secondary | ICD-10-CM | POA: Diagnosis not present

## 2016-08-14 ENCOUNTER — Telehealth: Payer: Self-pay | Admitting: Nurse Practitioner

## 2016-08-14 DIAGNOSIS — J069 Acute upper respiratory infection, unspecified: Secondary | ICD-10-CM

## 2016-08-14 MED ORDER — BENZONATATE 100 MG PO CAPS: 100.0000 mg | ORAL_CAPSULE | Freq: Two times a day (BID) | ORAL | 0 refills | Status: AC | PRN

## 2016-08-14 MED ORDER — AMOXICILLIN 500 MG PO TABS: 500.0000 mg | ORAL_TABLET | Freq: Two times a day (BID) | ORAL | 0 refills | Status: AC

## 2016-08-14 NOTE — Telephone Encounter (Signed)
Crystal Mercer has called and reports feeling worse 4 days after her appointment.  She now has a productive cough and worsening congestion.  She requests symptom relief for her cough and antibiotic if I feel she needs one.  Medications ordered Amoxicillin 500 mg tablet every 12 hours for 10 days Tessalon  tablet every 12 hours for 10 days as needed for cough

## 2016-08-18 DIAGNOSIS — M25521 Pain in right elbow: Secondary | ICD-10-CM | POA: Diagnosis not present

## 2016-08-21 DIAGNOSIS — M25521 Pain in right elbow: Secondary | ICD-10-CM | POA: Diagnosis not present

## 2016-08-24 DIAGNOSIS — G8918 Other acute postprocedural pain: Secondary | ICD-10-CM | POA: Diagnosis not present

## 2016-08-24 DIAGNOSIS — M7711 Lateral epicondylitis, right elbow: Secondary | ICD-10-CM | POA: Diagnosis not present

## 2016-08-28 DIAGNOSIS — M7711 Lateral epicondylitis, right elbow: Secondary | ICD-10-CM | POA: Diagnosis not present

## 2016-09-01 DIAGNOSIS — M7711 Lateral epicondylitis, right elbow: Secondary | ICD-10-CM | POA: Diagnosis not present

## 2016-10-09 DIAGNOSIS — M7711 Lateral epicondylitis, right elbow: Secondary | ICD-10-CM | POA: Diagnosis not present

## 2016-10-28 DIAGNOSIS — K219 Gastro-esophageal reflux disease without esophagitis: Secondary | ICD-10-CM | POA: Diagnosis not present

## 2016-10-28 DIAGNOSIS — D229 Melanocytic nevi, unspecified: Secondary | ICD-10-CM | POA: Diagnosis not present

## 2016-10-28 DIAGNOSIS — B078 Other viral warts: Secondary | ICD-10-CM | POA: Diagnosis not present

## 2016-10-28 DIAGNOSIS — R490 Dysphonia: Secondary | ICD-10-CM | POA: Diagnosis not present

## 2016-10-28 DIAGNOSIS — L814 Other melanin hyperpigmentation: Secondary | ICD-10-CM | POA: Diagnosis not present

## 2016-10-28 DIAGNOSIS — L603 Nail dystrophy: Secondary | ICD-10-CM | POA: Diagnosis not present

## 2016-10-28 DIAGNOSIS — F458 Other somatoform disorders: Secondary | ICD-10-CM | POA: Diagnosis not present

## 2016-11-23 ENCOUNTER — Ambulatory Visit: Payer: BLUE CROSS/BLUE SHIELD | Attending: Unknown Physician Specialty | Admitting: Speech Pathology

## 2016-12-08 DIAGNOSIS — S90121A Contusion of right lesser toe(s) without damage to nail, initial encounter: Secondary | ICD-10-CM | POA: Diagnosis not present

## 2017-04-16 DIAGNOSIS — G43109 Migraine with aura, not intractable, without status migrainosus: Secondary | ICD-10-CM | POA: Diagnosis not present

## 2017-04-16 DIAGNOSIS — H5310 Unspecified subjective visual disturbances: Secondary | ICD-10-CM | POA: Diagnosis not present

## 2017-07-29 DIAGNOSIS — Z6830 Body mass index (BMI) 30.0-30.9, adult: Secondary | ICD-10-CM | POA: Diagnosis not present

## 2017-07-29 DIAGNOSIS — Z1231 Encounter for screening mammogram for malignant neoplasm of breast: Secondary | ICD-10-CM | POA: Diagnosis not present

## 2017-07-29 DIAGNOSIS — Z803 Family history of malignant neoplasm of breast: Secondary | ICD-10-CM | POA: Diagnosis not present

## 2017-07-29 DIAGNOSIS — Z01419 Encounter for gynecological examination (general) (routine) without abnormal findings: Secondary | ICD-10-CM | POA: Diagnosis not present

## 2017-09-10 DIAGNOSIS — Z809 Family history of malignant neoplasm, unspecified: Secondary | ICD-10-CM | POA: Diagnosis not present

## 2017-09-15 ENCOUNTER — Other Ambulatory Visit: Payer: Self-pay | Admitting: Obstetrics and Gynecology

## 2017-09-15 DIAGNOSIS — Z803 Family history of malignant neoplasm of breast: Secondary | ICD-10-CM

## 2017-10-01 ENCOUNTER — Inpatient Hospital Stay
Admission: RE | Admit: 2017-10-01 | Discharge: 2017-10-01 | Disposition: A | Discharge status: Home / Self Care | Payer: BLUE CROSS/BLUE SHIELD | Source: Ambulatory Visit | Attending: Obstetrics and Gynecology | Admitting: Obstetrics and Gynecology

## 2017-10-01 DIAGNOSIS — J00 Acute nasopharyngitis [common cold]: Secondary | ICD-10-CM | POA: Diagnosis not present

## 2017-10-01 DIAGNOSIS — J029 Acute pharyngitis, unspecified: Secondary | ICD-10-CM | POA: Diagnosis not present

## 2017-10-07 ENCOUNTER — Ambulatory Visit (INDEPENDENT_AMBULATORY_CARE_PROVIDER_SITE_OTHER): Payer: BLUE CROSS/BLUE SHIELD | Admitting: Nurse Practitioner

## 2017-10-07 ENCOUNTER — Other Ambulatory Visit: Payer: Self-pay

## 2017-10-07 VITALS — BP 134/77 | HR 76 | Temp 97.6°F | Resp 18 | Ht 68.0 in | Wt 184.0 lb

## 2017-10-07 DIAGNOSIS — J22 Unspecified acute lower respiratory infection: Secondary | ICD-10-CM | POA: Diagnosis not present

## 2017-10-07 DIAGNOSIS — R05 Cough: Secondary | ICD-10-CM | POA: Diagnosis not present

## 2017-10-07 DIAGNOSIS — R059 Cough, unspecified: Secondary | ICD-10-CM

## 2017-10-07 MED ORDER — HYDROCOD POLST-CPM POLST ER 10-8 MG/5ML PO SUER: 5.0000 mL | Freq: Every evening | ORAL | 0 refills | Status: AC | PRN

## 2017-10-07 MED ORDER — CEFTRIAXONE SODIUM 1 G IJ SOLR: 1.0000 g | Freq: Once | INTRAMUSCULAR | 0 refills | Status: AC

## 2017-10-07 MED ORDER — CEFTRIAXONE SODIUM 1 G IJ SOLR
1.0000 g | Freq: Once | INTRAMUSCULAR | Status: AC
  Administered 2017-10-07: 1 g via INTRAMUSCULAR

## 2017-10-07 MED ORDER — AZITHROMYCIN 250 MG PO TABS: ORAL_TABLET | ORAL | 0 refills | Status: DC

## 2017-10-07 NOTE — Progress Notes (Signed)
Subjective:    Patient ID: Crystal Mercer, female    DOB: Jan 11, 1976, 42 y.o.   MRN: [id number]  Crystal Mercer is a 42 y.o. female presenting on 10/07/2017 for Nasal Congestion (runny nose, nasal congestion, cough x 15 days. Pt seen at [facility] diagnose w/ a severe cold )   HPI URI Pt presents with URI symptoms x 15 days.  Symptoms include rhinorrhea, nasal congestion, cough.  Pt has productive cough with yellow, stringy mucous.  Pt states cough is a "seal barking cough" x 7 days.  - Is taking mucinex.   - No noticed fevers. - Usually only seasonal allergies with first blooms, no current symptoms of allergies.   Social History   Tobacco Use  . Smoking status: Never Smoker  . Smokeless tobacco: Never Used  . Tobacco comment: tobacco use - no  Substance Use Topics  . Alcohol use: No    Alcohol/week: 0.0 oz  . Drug use: No    Review of Systems Per HPI unless specifically indicated above     Objective:    BP 134/77 (BP Location: Right Arm, Patient Position: Sitting, Cuff Size: Normal)   Pulse 76   Temp 97.6 F (36.4 C) (Oral)   Resp 18   Ht  (1.727 m)   Wt 184 lb (83.5 kg)   SpO2 100%   BMI 27.98 kg/m   Wt Readings from Last 3 Encounters:  10/07/17 184 lb (83.5 kg)  08/11/16 189 lb (85.7 kg)  09/03/15 190 lb (86.2 kg)    Physical Exam  Constitutional: She is oriented to person, place, and time. She appears well-developed and well-nourished. No distress.  HENT:  Head: Normocephalic and atraumatic.  Right Ear: Hearing, tympanic membrane, external ear and ear canal normal.  Left Ear: Hearing, tympanic membrane, external ear and ear canal normal.  Nose: No mucosal edema or rhinorrhea. Right sinus exhibits no maxillary sinus tenderness and no frontal sinus tenderness. Left sinus exhibits no maxillary sinus tenderness and no frontal sinus tenderness.  Mouth/Throat: Uvula is midline and mucous membranes are normal. Oropharyngeal exudate and posterior  oropharyngeal erythema (injected) present. No posterior oropharyngeal edema. Tonsils are 0 on the right. Tonsils are 0 on the left.  Eyes: Pupils are equal, round, and reactive to light. Conjunctivae, EOM and lids are normal.  Neck: Normal range of motion. Neck supple.  Cardiovascular: Normal rate, regular rhythm, S1 normal, S2 normal, normal heart sounds and intact distal pulses.  Pulmonary/Chest: Effort normal. No respiratory distress. She has wheezes (throughout lungs). She has rhonchi (throughout lungs). She has rales (throughout lungs).  Lymphadenopathy:    She has cervical adenopathy.  Neurological: She is alert and oriented to person, place, and time.  Skin: Skin is warm and dry.  Psychiatric: She has a normal mood and affect. Her behavior is normal.  Vitals reviewed.  Results for orders placed or performed in visit on 08/11/16  POCT rapid strep A  Result Value Ref Range   Rapid Strep A Screen Negative Negative      Assessment & Plan:   Problem List Items Addressed This Visit    None    Visit Diagnoses    Lower respiratory infection (e.g., bronchitis, pneumonia, pneumonitis, pulmonitis)    -  Primary   Relevant Medications   azithromycin (ZITHROMAX) 250 MG tablet   cefTRIAXone (ROCEPHIN) injection 1 g (Completed)   Cough       Relevant Medications   cefTRIAXone (ROCEPHIN) injection 1 g (Completed)  Presumed CAP in otherwise healthy adult. May have evolved from prolonged viral URI / bronchitis. Not recent hospitalization or IV antibiotics within past 90 days.  - Afebrile, tachycardic, no hypoxia (100% on RA)  Plan: 1. Start empiric antibiotics with Ceftriaxone IM 1g x 1 dose in office, Azithromycin PO  (day 1) then  daily x 4 days 2. Recommend improve hydration and add Mucinex-DM twice daily for up to 5-7 days if sputum still thick 3. Tessalon Perls for cough TID PRN 4. Tylenol, Ibuprofen PRN fevers 5. RTC within 1 week if not improving, otherwise strict  return criteria to go to ED   Meds ordered this encounter  Medications  . azithromycin (ZITHROMAX) 250 MG tablet    Sig: Take one tablet twice today.  Then, take 1 tablet daily for 4 days.    Dispense:  6 tablet    Refill:  0    Order Specific Question:   Supervising Provider    Answer:   Smitty Cords [[id number]]  . cefTRIAXone (ROCEPHIN) 1 g injection    Sig: Inject 1 g into the muscle once for 1 dose.    Dispense:  1 each    Refill:  0    Order Specific Question:   Supervising Provider    Answer:   Smitty Cords [[id number]]  . chlorpheniramine-HYDROcodone (TUSSIONEX PENNKINETIC ER) 10-8 MG/5ML SUER    Sig: Take 5 mLs by mouth at bedtime as needed for up to 5 days for cough.    Dispense:  25 mL    Refill:  0    Order Specific Question:   Supervising Provider    Answer:   Smitty Cords [[id number]]  . cefTRIAXone (ROCEPHIN) injection 1 g    Follow up plan: Return 5-7 days if symptoms worsen or fail to improve.  Wilhelmina Mcardle, DNP, AGPCNP-BC Adult Gerontology Primary Care Nurse Practitioner [facility] 10/15/2017, 6:54 PM

## 2017-10-07 NOTE — Patient Instructions (Addendum)
Crystal Mercer,   Thank you for coming in to clinic today.  1. You likely have pneumonia.  - Continue Mucinex-DM - START azithromycin 250 mg tablet. Take 2 tablets today.  Then, take 1 tablet daily for the next 4 days and stop.    Please schedule a follow-up appointment with Wilhelmina Mcardle, AGNP. Return 5-7 days if symptoms worsen or fail to improve.  Call if needed.  If you have any other questions or concerns, please feel free to call the clinic or send a message through MyChart. You may also schedule an earlier appointment if necessary.  You will receive a survey after today's visit either digitally by e-mail or paper by [employer]. Your experiences and feedback matter to [place].  Please respond so we know how we are doing as we provide care for you.   Wilhelmina Mcardle, DNP, AGNP-BC Adult Gerontology Nurse Practitioner [facility], [facility]

## 2017-10-12 DIAGNOSIS — L282 Other prurigo: Secondary | ICD-10-CM | POA: Diagnosis not present

## 2017-10-12 DIAGNOSIS — L300 Nummular dermatitis: Secondary | ICD-10-CM | POA: Diagnosis not present

## 2017-10-15 ENCOUNTER — Encounter: Payer: Self-pay | Admitting: Nurse Practitioner

## 2017-10-18 ENCOUNTER — Ambulatory Visit
Admission: RE | Admit: 2017-10-18 | Discharge: 2017-10-18 | Disposition: A | Discharge status: Home / Self Care | Payer: BLUE CROSS/BLUE SHIELD | Source: Ambulatory Visit | Attending: Obstetrics and Gynecology | Admitting: Obstetrics and Gynecology

## 2017-10-18 DIAGNOSIS — Z803 Family history of malignant neoplasm of breast: Secondary | ICD-10-CM | POA: Diagnosis not present

## 2017-10-18 MED ORDER — GADOBENATE DIMEGLUMINE 529 MG/ML IV SOLN
17.0000 mL | Freq: Once | INTRAVENOUS | Status: AC | PRN
  Administered 2017-10-18: 17 mL via INTRAVENOUS

## 2017-10-20 ENCOUNTER — Other Ambulatory Visit: Payer: Self-pay | Admitting: Obstetrics and Gynecology

## 2017-10-20 DIAGNOSIS — R9389 Abnormal findings on diagnostic imaging of other specified body structures: Secondary | ICD-10-CM

## 2017-10-22 ENCOUNTER — Ambulatory Visit: Admission: RE | Admit: 2017-10-22 | Payer: BLUE CROSS/BLUE SHIELD | Source: Ambulatory Visit

## 2017-10-22 ENCOUNTER — Ambulatory Visit
Admission: RE | Admit: 2017-10-22 | Discharge: 2017-10-22 | Disposition: A | Discharge status: Home / Self Care | Payer: BLUE CROSS/BLUE SHIELD | Source: Ambulatory Visit | Attending: Obstetrics and Gynecology | Admitting: Obstetrics and Gynecology

## 2017-10-22 ENCOUNTER — Other Ambulatory Visit: Payer: Self-pay | Admitting: Obstetrics and Gynecology

## 2017-10-22 DIAGNOSIS — R9389 Abnormal findings on diagnostic imaging of other specified body structures: Secondary | ICD-10-CM

## 2017-10-22 DIAGNOSIS — N6489 Other specified disorders of breast: Secondary | ICD-10-CM | POA: Diagnosis not present

## 2017-10-22 MED ORDER — GADOBENATE DIMEGLUMINE 529 MG/ML IV SOLN
17.0000 mL | Freq: Once | INTRAVENOUS | Status: AC | PRN
  Administered 2017-10-22: 17 mL via INTRAVENOUS

## 2018-06-08 DIAGNOSIS — M542 Cervicalgia: Secondary | ICD-10-CM | POA: Diagnosis not present

## 2018-06-08 DIAGNOSIS — M25512 Pain in left shoulder: Secondary | ICD-10-CM | POA: Diagnosis not present

## 2018-07-19 ENCOUNTER — Ambulatory Visit (INDEPENDENT_AMBULATORY_CARE_PROVIDER_SITE_OTHER): Payer: BLUE CROSS/BLUE SHIELD | Admitting: Nurse Practitioner

## 2018-07-19 ENCOUNTER — Other Ambulatory Visit: Payer: Self-pay

## 2018-07-19 ENCOUNTER — Encounter: Payer: Self-pay | Admitting: Nurse Practitioner

## 2018-07-19 VITALS — BP 123/79 | HR 86 | Temp 98.5°F | Ht 68.0 in | Wt 189.0 lb

## 2018-07-19 DIAGNOSIS — J029 Acute pharyngitis, unspecified: Secondary | ICD-10-CM | POA: Diagnosis not present

## 2018-07-19 DIAGNOSIS — R5382 Chronic fatigue, unspecified: Secondary | ICD-10-CM | POA: Diagnosis not present

## 2018-07-19 DIAGNOSIS — Z20828 Contact with and (suspected) exposure to other viral communicable diseases: Secondary | ICD-10-CM | POA: Diagnosis not present

## 2018-07-19 DIAGNOSIS — E559 Vitamin D deficiency, unspecified: Secondary | ICD-10-CM | POA: Diagnosis not present

## 2018-07-19 DIAGNOSIS — G43111 Migraine with aura, intractable, with status migrainosus: Secondary | ICD-10-CM | POA: Diagnosis not present

## 2018-07-19 LAB — POCT RAPID STREP A (OFFICE): Rapid Strep A Screen: NEGATIVE

## 2018-07-19 LAB — POCT INFLUENZA A/B
Influenza A, POC: NEGATIVE
Influenza B, POC: NEGATIVE

## 2018-07-19 MED ORDER — SUMATRIPTAN SUCCINATE 25 MG PO TABS: 25.0000 mg | ORAL_TABLET | ORAL | 2 refills | Status: AC | PRN

## 2018-07-19 MED ORDER — OSELTAMIVIR PHOSPHATE 75 MG PO CAPS: 75.0000 mg | ORAL_CAPSULE | Freq: Every day | ORAL | 0 refills | Status: AC

## 2018-07-19 NOTE — Patient Instructions (Addendum)
Crystal Mercer,   Thank you for coming in to clinic today.  1. START sumatriptan 25 mg once at start of headache. May repeat once in 2 hours.  Don't take with Excedrin. Take after if needed or try at the start of a headache occasionally to see if more effective.  2. Can consider future once monthly injection if not effective.  3. Fatigue - labs today  4. For Tamiflu, Start 1 capsule twice a day for 5 days IF you develop symptoms, otherwise the preventative dose can be 1 capsule daily for 10 days.  Please schedule a follow-up appointment with Wilhelmina Mcardle, AGNP. Return 4-6 weeks if symptoms worsen or fail to improve.  If you have any other questions or concerns, please feel free to call the clinic or send a message through MyChart. You may also schedule an earlier appointment if necessary.  You will receive a survey after today's visit either digitally by e-mail or paper by [employer]. Your experiences and feedback matter to [place].  Please respond so we know how we are doing as we provide care for you.   Wilhelmina Mcardle, DNP, AGNP-BC Adult Gerontology Nurse Practitioner [facility], [facility]

## 2018-07-19 NOTE — Progress Notes (Signed)
Subjective:    Patient ID: Crystal Mercer, female    DOB: 1975/07/23, 43 y.o.   MRN: [id number]  Crystal Mercer is a 43 y.o. female presenting on 07/19/2018 for Sore Throat (pt exposed to strep &influenza at church. sore throat started today, migraines x 5 days. Fatigue x 2 weeks  )   HPI Migraine  Single headache for 5 days.  Patient has had migraines since age 73.  Per month over last 6-12 months has had 3 per month.  Normally can take Excedrin migraine and lasts for hours and is more tolerable.  If not stopped early lasts 3-7 days. - Tried shots, drops from allergy specialist. - Eats choclate, pepsi, excedrin migraine. 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  Fatigue New onset fatigue x 2 weeks.  Elevated liver enzymes were found on initial blood work on labs several years ago when she had increased fatigue.  Had resolution of elevated liver enzymes about 1 year later and fatigue improved.  Patient is concerned her enzymes could be elevated again.  Sore throat x 4-5 hours.  Patient is concerned for strep and flu because she has been exposed to sick youth at her church 2 days ago.    Social History   Tobacco Use  . Smoking status: Never Smoker  . Smokeless tobacco: Never Used  . Tobacco comment: tobacco use - no  Substance Use Topics  . Alcohol use: No    Alcohol/week: 0.0 standard drinks  . Drug use: No    Review of Systems Per HPI unless specifically indicated above     Objective:    BP 123/79 (BP Location: Left Arm, Patient Position: Sitting, Cuff Size: Normal)   Pulse 86   Temp 98.5 F (36.9 C) (Oral)   Ht  (1.727 m)   Wt 189 lb (85.7 kg)   BMI 28.74 kg/m   Wt Readings from Last 3 Encounters:  07/19/18 189 lb (85.7 kg)  10/07/17 184 lb (83.5 kg)  08/11/16 189 lb (85.7 kg)    Physical Exam Vitals signs and nursing note reviewed.  Constitutional:      General: She is not in acute  distress.    Appearance: She is well-developed.  HENT:     Head: Normocephalic and atraumatic.     Right Ear: External ear normal.     Left Ear: External ear normal.     Nose: Nose normal. No congestion or rhinorrhea.     Mouth/Throat:     Mouth: Mucous membranes are moist.     Pharynx: Oropharynx is clear. Posterior oropharyngeal erythema (mildly injected) present.  Eyes:     Conjunctiva/sclera: Conjunctivae normal.     Pupils: Pupils are equal, round, and reactive to light.  Neck:     Musculoskeletal: Normal range of motion and neck supple.     Thyroid: No thyromegaly.     Vascular: No JVD.     Trachea: No tracheal deviation.  Cardiovascular:     Rate and Rhythm: Normal rate and regular rhythm.     Heart sounds: Normal heart sounds. No murmur. No friction rub. No gallop.   Pulmonary:     Effort: Pulmonary effort is normal. No respiratory distress.     Breath sounds: Normal breath sounds.  Abdominal:     General: Bowel sounds are normal. There is no distension.     Palpations: Abdomen is soft. There is no  hepatomegaly or splenomegaly.     Tenderness: There is no abdominal tenderness.  Musculoskeletal: Normal range of motion.  Lymphadenopathy:     Cervical: No cervical adenopathy.  Skin:    General: Skin is warm and dry.  Neurological:     Mental Status: She is alert and oriented to person, place, and time.  Psychiatric:        Behavior: Behavior normal.        Thought Content: Thought content normal.        Judgment: Judgment normal.    Results for orders placed or performed in visit on 08/11/16  POCT rapid strep A  Result Value Ref Range   Rapid Strep A Screen Negative Negative      Assessment & Plan:   Problem List Items Addressed This Visit      Other   Fatigue   Relevant Orders   COMPLETE METABOLIC PANEL WITH GFR (Completed)   CBC with Differential/Platelet (Completed)   TSH (Completed)   VITAMIN D 25 Hydroxy (Vit-D Deficiency, Fractures) (Completed)     Other Visit Diagnoses    Exposure to the flu    -  Primary   Relevant Medications   oseltamivir (TAMIFLU) 75 MG capsule   Other Relevant Orders   POCT Influenza A/B (Completed)   Intractable migraine with aura with status migrainosus       Relevant Medications   SUMAtriptan (IMITREX) 25 MG tablet   Acute sore throat       Relevant Orders   POCT rapid strep A (Completed)       # Fatigue Patient is having mild fatigue, desires labs to determine cause since was due to elevated liver enzymes several years ago.  No current problems with sleep.  Plan: 1. Labs ordered. 2. Discussed there are many causes of fatigue.  Encouraged increasing regular physical activity. 3. Follow-up prn.   # Migraine Intractable, with status but now resolving.  Patient has long history of migraine and is only using OTC analgesics currently.  Plan: 1. Start sumatriptan 25 mg once at start of headache.  Repeat x 1 dose in 2 hours as needed if not resolved.   - Advised not to take with Excedrin 2. Consider prophylactic treatment with propranolol/topiramate or CGRP-ra in future. 3. Follow-up prn and in 3-6 months.   # Sore throat/Flu exposure Acute illness. Fever responsive to NSAIDs and tylenol.  Symptoms not worsening. Consistent with viral illness x < 1 days with known sick contacts and no identifiable focal infections of ears, nose, throat. - Flu and strep tests negative in clinic today.  Plan: 1. Reassurance, likely self-limited with cough lasting up to few weeks - START sore throat lozenges/spray as needed - Start Mucinex-DM OTC up to 7-10 days then stop if needed - Provided Tamiflu for prophylaxis - take tamiflu 75 mg capsule once daily for 10 days.  Increase to twice daily for remaining pills if becomes symptomatic with flu. 2. Supportive care with nasal saline, warm herbal tea with honey, 3. Improve hydration 4. Tylenol / Motrin PRN fevers 5. Return criteria given   Meds ordered this  encounter  Medications  . oseltamivir (TAMIFLU) 75 MG capsule    Sig: Take 1 capsule (75 mg total) by mouth daily for 10 days. If you develop symptoms of flu, take twice daily for remainder of prescription.    Dispense:  10 capsule    Refill:  0    Order Specific Question:   Supervising Provider  Answer:   Smitty Cords [[id number]]  . SUMAtriptan (IMITREX) 25 MG tablet    Sig: Take 1 tablet (25 mg total) by mouth every 2 (two) hours as needed for migraine. May repeat in 2 hours if headache persists or recurs.    Dispense:  12 tablet    Refill:  2    Order Specific Question:   Supervising Provider    Answer:   Smitty Cords [[id number]]    Follow up plan: Return 4-6 weeks if symptoms worsen or fail to improve.  Wilhelmina Mcardle, DNP, AGPCNP-BC Adult Gerontology Primary Care Nurse Practitioner [facility] 07/19/2018, 11:26 AM

## 2018-07-20 LAB — CBC WITH DIFFERENTIAL/PLATELET
Absolute Monocytes: 403 cells/uL (ref 200–950)
Basophils Absolute: 53 cells/uL (ref 0–200)
Basophils Relative: 0.8 %
Eosinophils Absolute: 92 cells/uL (ref 15–500)
Eosinophils Relative: 1.4 %
HCT: 45.6 % — ABNORMAL HIGH (ref 35.0–45.0)
Hemoglobin: 15.8 g/dL — ABNORMAL HIGH (ref 11.7–15.5)
Lymphs Abs: 1756 cells/uL (ref 850–3900)
MCH: 32 pg (ref 27.0–33.0)
MCHC: 34.6 g/dL (ref 32.0–36.0)
MCV: 92.5 fL (ref 80.0–100.0)
MPV: 10.8 fL (ref 7.5–12.5)
Monocytes Relative: 6.1 %
Neutro Abs: 4297 cells/uL (ref 1500–7800)
Neutrophils Relative %: 65.1 %
Platelets: 279 10*3/uL (ref 140–400)
RBC: 4.93 10*6/uL (ref 3.80–5.10)
RDW: 11.8 % (ref 11.0–15.0)
Total Lymphocyte: 26.6 %
WBC: 6.6 10*3/uL (ref 3.8–10.8)

## 2018-07-20 LAB — COMPLETE METABOLIC PANEL WITH GFR
AG Ratio: 1.4 (calc) (ref 1.0–2.5)
ALT: 16 U/L (ref 6–29)
AST: 14 U/L (ref 10–30)
Albumin: 4.5 g/dL (ref 3.6–5.1)
Alkaline phosphatase (APISO): 83 U/L (ref 31–125)
BUN: 13 mg/dL (ref 7–25)
CO2: 26 mmol/L (ref 20–32)
Calcium: 9.7 mg/dL (ref 8.6–10.2)
Chloride: 103 mmol/L (ref 98–110)
Creat: 0.74 mg/dL (ref 0.50–1.10)
GFR, Est African American: 117 mL/min/{1.73_m2} (ref >=60)
GFR, Est Non African American: 101 mL/min/{1.73_m2} (ref >=60)
Globulin: 3.2 g/dL (calc) (ref 1.9–3.7)
Glucose, Bld: 98 mg/dL (ref 65–99)
Potassium: 4.4 mmol/L (ref 3.5–5.3)
Sodium: 139 mmol/L (ref 135–146)
Total Bilirubin: 0.6 mg/dL (ref 0.2–1.2)
Total Protein: 7.7 g/dL (ref 6.1–8.1)

## 2018-07-20 LAB — TSH: TSH: 1.19 mIU/L

## 2018-07-20 LAB — VITAMIN D 25 HYDROXY (VIT D DEFICIENCY, FRACTURES): Vit D, 25-Hydroxy: 29 ng/mL — ABNORMAL LOW (ref 30–100)

## 2018-07-26 ENCOUNTER — Encounter: Payer: Self-pay | Admitting: Nurse Practitioner

## 2019-01-31 DIAGNOSIS — R490 Dysphonia: Secondary | ICD-10-CM | POA: Diagnosis not present

## 2019-01-31 DIAGNOSIS — J309 Allergic rhinitis, unspecified: Secondary | ICD-10-CM | POA: Diagnosis not present

## 2019-03-14 DIAGNOSIS — J301 Allergic rhinitis due to pollen: Secondary | ICD-10-CM | POA: Diagnosis not present

## 2019-03-16 DIAGNOSIS — J305 Allergic rhinitis due to food: Secondary | ICD-10-CM | POA: Diagnosis not present

## 2019-03-16 DIAGNOSIS — J309 Allergic rhinitis, unspecified: Secondary | ICD-10-CM | POA: Diagnosis not present

## 2019-05-01 DIAGNOSIS — G43909 Migraine, unspecified, not intractable, without status migrainosus: Secondary | ICD-10-CM | POA: Diagnosis not present

## 2019-05-01 DIAGNOSIS — H53453 Other localized visual field defect, bilateral: Secondary | ICD-10-CM | POA: Diagnosis not present

## 2019-05-01 DIAGNOSIS — H16142 Punctate keratitis, left eye: Secondary | ICD-10-CM | POA: Diagnosis not present

## 2019-05-17 DIAGNOSIS — H53453 Other localized visual field defect, bilateral: Secondary | ICD-10-CM | POA: Diagnosis not present

## 2019-05-17 DIAGNOSIS — G43119 Migraine with aura, intractable, without status migrainosus: Secondary | ICD-10-CM | POA: Diagnosis not present

## 2019-05-18 ENCOUNTER — Other Ambulatory Visit (HOSPITAL_COMMUNITY): Payer: Self-pay | Admitting: Neurology

## 2019-05-18 ENCOUNTER — Other Ambulatory Visit: Payer: Self-pay | Admitting: Neurology

## 2019-05-18 DIAGNOSIS — G43119 Migraine with aura, intractable, without status migrainosus: Secondary | ICD-10-CM

## 2019-05-23 DIAGNOSIS — E538 Deficiency of other specified B group vitamins: Secondary | ICD-10-CM | POA: Diagnosis not present

## 2019-05-23 DIAGNOSIS — E559 Vitamin D deficiency, unspecified: Secondary | ICD-10-CM | POA: Diagnosis not present

## 2019-05-23 DIAGNOSIS — G43119 Migraine with aura, intractable, without status migrainosus: Secondary | ICD-10-CM | POA: Diagnosis not present

## 2019-05-26 ENCOUNTER — Ambulatory Visit: Payer: BC Managed Care – PPO

## 2019-06-14 DIAGNOSIS — G43119 Migraine with aura, intractable, without status migrainosus: Secondary | ICD-10-CM | POA: Diagnosis not present

## 2019-06-14 DIAGNOSIS — H53453 Other localized visual field defect, bilateral: Secondary | ICD-10-CM | POA: Diagnosis not present

## 2020-04-18 DIAGNOSIS — H53453 Other localized visual field defect, bilateral: Secondary | ICD-10-CM | POA: Diagnosis not present

## 2020-04-18 DIAGNOSIS — H16142 Punctate keratitis, left eye: Secondary | ICD-10-CM | POA: Diagnosis not present

## 2020-04-18 DIAGNOSIS — H5213 Myopia, bilateral: Secondary | ICD-10-CM | POA: Diagnosis not present

## 2020-04-18 DIAGNOSIS — G43909 Migraine, unspecified, not intractable, without status migrainosus: Secondary | ICD-10-CM | POA: Diagnosis not present

## 2021-04-28 DIAGNOSIS — G43909 Migraine, unspecified, not intractable, without status migrainosus: Secondary | ICD-10-CM | POA: Diagnosis not present

## 2021-04-28 DIAGNOSIS — H5213 Myopia, bilateral: Secondary | ICD-10-CM | POA: Diagnosis not present

## 2021-06-23 ENCOUNTER — Other Ambulatory Visit: Payer: Self-pay | Admitting: Orthopedic Surgery

## 2021-06-23 DIAGNOSIS — M75101 Unspecified rotator cuff tear or rupture of right shoulder, not specified as traumatic: Secondary | ICD-10-CM

## 2021-06-27 ENCOUNTER — Ambulatory Visit
Admission: RE | Admit: 2021-06-27 | Discharge: 2021-06-27 | Disposition: A | Discharge status: Home / Self Care | Payer: BC Managed Care – PPO | Source: Ambulatory Visit | Attending: Orthopedic Surgery | Admitting: Orthopedic Surgery

## 2021-06-27 DIAGNOSIS — M75101 Unspecified rotator cuff tear or rupture of right shoulder, not specified as traumatic: Secondary | ICD-10-CM

## 2021-06-30 ENCOUNTER — Other Ambulatory Visit: Payer: BC Managed Care – PPO
